# Patient Record
Sex: Male | Born: 1990 | Race: White | Hispanic: Yes | Marital: Single
Health system: Southern US, Community
[De-identification: ages and names within clinical notes are randomized; demographics above are authoritative.]

---

## 2018-07-22 ENCOUNTER — Emergency Department (HOSPITAL_COMMUNITY): Payer: No Typology Code available for payment source

## 2018-07-22 ENCOUNTER — Emergency Department (HOSPITAL_COMMUNITY)
Admission: EM | Admit: 2018-07-22 | Discharge: 2018-07-27 | Disposition: E | Payer: No Typology Code available for payment source | Attending: Emergency Medicine | Admitting: Emergency Medicine

## 2018-07-22 DIAGNOSIS — T1490XA Injury, unspecified, initial encounter: Secondary | ICD-10-CM | POA: Diagnosis not present

## 2018-07-22 DIAGNOSIS — Y999 Unspecified external cause status: Secondary | ICD-10-CM | POA: Insufficient documentation

## 2018-07-22 DIAGNOSIS — J942 Hemothorax: Secondary | ICD-10-CM | POA: Insufficient documentation

## 2018-07-22 DIAGNOSIS — G935 Compression of brain: Secondary | ICD-10-CM | POA: Diagnosis not present

## 2018-07-22 DIAGNOSIS — Y929 Unspecified place or not applicable: Secondary | ICD-10-CM | POA: Diagnosis not present

## 2018-07-22 DIAGNOSIS — Y939 Activity, unspecified: Secondary | ICD-10-CM | POA: Insufficient documentation

## 2018-07-22 LAB — COMPREHENSIVE METABOLIC PANEL
ALK PHOS: 67 U/L (ref 38–126)
ALT: 251 U/L — ABNORMAL HIGH (ref 0–44)
AST: 274 U/L — AB (ref 15–41)
Albumin: 2.9 g/dL — ABNORMAL LOW (ref 3.5–5.0)
Anion gap: 12 (ref 5–15)
BUN: 13 mg/dL (ref 6–20)
CALCIUM: 7.2 mg/dL — AB (ref 8.9–10.3)
CO2: 19 mmol/L — ABNORMAL LOW (ref 22–32)
Chloride: 103 mmol/L (ref 98–111)
Creatinine, Ser: 1.29 mg/dL — ABNORMAL HIGH (ref 0.61–1.24)
Glucose, Bld: 377 mg/dL — ABNORMAL HIGH (ref 70–99)
Potassium: 2.8 mmol/L — ABNORMAL LOW (ref 3.5–5.1)
Sodium: 134 mmol/L — ABNORMAL LOW (ref 135–145)
Total Bilirubin: 0.6 mg/dL (ref 0.3–1.2)
Total Protein: 4.6 g/dL — ABNORMAL LOW (ref 6.5–8.1)

## 2018-07-22 LAB — I-STAT CHEM 8, ED
BUN: 16 mg/dL (ref 6–20)
Calcium, Ion: 0.95 mmol/L — ABNORMAL LOW (ref 1.15–1.40)
Chloride: 101 mmol/L (ref 98–111)
Creatinine, Ser: 1.6 mg/dL — ABNORMAL HIGH (ref 0.61–1.24)
Glucose, Bld: 365 mg/dL — ABNORMAL HIGH (ref 70–99)
HEMATOCRIT: 34 % — AB (ref 39.0–52.0)
HEMOGLOBIN: 11.6 g/dL — AB (ref 13.0–17.0)
POTASSIUM: 2.4 mmol/L — AB (ref 3.5–5.1)
SODIUM: 136 mmol/L (ref 135–145)
TCO2: 20 mmol/L — AB (ref 22–32)

## 2018-07-22 LAB — ETHANOL: ALCOHOL ETHYL (B): 233 mg/dL — AB (ref <10)

## 2018-07-22 LAB — I-STAT CG4 LACTIC ACID, ED: Lactic Acid, Venous: 5.22 mmol/L (ref 0.5–1.9)

## 2018-07-22 MED ORDER — IOPAMIDOL (ISOVUE-300) INJECTION 61%
100.0000 mL | Freq: Once | INTRAVENOUS | Status: AC | PRN
  Administered 2018-07-22: 100 mL via INTRAVENOUS

## 2018-07-22 MED ORDER — SODIUM CHLORIDE 0.9 % IV SOLN
INTRAVENOUS | Status: AC | PRN
  Administered 2018-07-22: 1000 mL via INTRAVENOUS
  Administered 2018-07-22: 2000 mL via INTRAVENOUS

## 2018-07-22 MED ORDER — NOREPINEPHRINE 4 MG/250ML-% IV SOLN
0.0000 ug/min | Freq: Once | INTRAVENOUS | Status: AC
  Administered 2018-07-22: 5 ug/min via INTRAVENOUS
  Filled 2018-07-22: qty 250

## 2018-07-22 NOTE — ED Notes (Signed)
Called Blood Bank, Activated the Mass Transfusion

## 2018-07-22 NOTE — ED Notes (Addendum)
Pt critical labs of elevated Lactic and low K notified Dr. Eudelia Bunchardama

## 2018-07-22 NOTE — ED Notes (Signed)
Unit #5 of blood
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
## 2018-07-23 ENCOUNTER — Encounter (HOSPITAL_COMMUNITY): Payer: Self-pay | Admitting: *Deleted

## 2018-07-23 LAB — BPAM RBC
BLOOD PRODUCT EXPIRATION DATE: 201908082359
BLOOD PRODUCT EXPIRATION DATE: 201908082359
BLOOD PRODUCT EXPIRATION DATE: 201909012359
BLOOD PRODUCT EXPIRATION DATE: 201909012359
BLOOD PRODUCT EXPIRATION DATE: 201909012359
Blood Product Expiration Date: 201908052359
Blood Product Expiration Date: 201908112359
Blood Product Expiration Date: 201908112359
Blood Product Expiration Date: 201908112359
Blood Product Expiration Date: 201909012359
ISSUE DATE / TIME: 201907272250
ISSUE DATE / TIME: 201907272250
ISSUE DATE / TIME: 201907272315
ISSUE DATE / TIME: 201907272315
ISSUE DATE / TIME: 201907272315
ISSUE DATE / TIME: 201907272332
ISSUE DATE / TIME: 201907280936
ISSUE DATE / TIME: 201907272315
ISSUE DATE / TIME: 201907272332
ISSUE DATE / TIME: 201907272332
UNIT TYPE AND RH: 5100
UNIT TYPE AND RH: 9500
UNIT TYPE AND RH: 9500
Unit Type and Rh: 5100
Unit Type and Rh: 5100
Unit Type and Rh: 5100
Unit Type and Rh: 9500
Unit Type and Rh: 9500
Unit Type and Rh: 9500
Unit Type and Rh: 9500

## 2018-07-23 LAB — TYPE AND SCREEN
ABO/RH(D): O POS
Antibody Screen: NEGATIVE
UNIT DIVISION: 0
UNIT DIVISION: 0
UNIT DIVISION: 0
UNIT DIVISION: 0
Unit division: 0
Unit division: 0
Unit division: 0
Unit division: 0
Unit division: 0
Unit division: 0

## 2018-07-23 LAB — BPAM FFP
BLOOD PRODUCT EXPIRATION DATE: 201908162359
BLOOD PRODUCT EXPIRATION DATE: 201908172359
BLOOD PRODUCT EXPIRATION DATE: 201908202359
Blood Product Expiration Date: 201907312359
Blood Product Expiration Date: 201907312359
Blood Product Expiration Date: 201907312359
Blood Product Expiration Date: 201908102359
Blood Product Expiration Date: 201908172359
ISSUE DATE / TIME: 201907272251
ISSUE DATE / TIME: 201907272251
ISSUE DATE / TIME: 201907272317
ISSUE DATE / TIME: 201907272317
ISSUE DATE / TIME: 201907272317
ISSUE DATE / TIME: 201907272333
ISSUE DATE / TIME: 201907272317
ISSUE DATE / TIME: 201907272333
UNIT TYPE AND RH: 6200
UNIT TYPE AND RH: 6200
UNIT TYPE AND RH: 6200
Unit Type and Rh: 600
Unit Type and Rh: 600
Unit Type and Rh: 600
Unit Type and Rh: 6200
Unit Type and Rh: 6200

## 2018-07-23 LAB — PREPARE PLATELET PHERESIS: Unit division: 0

## 2018-07-23 LAB — PREPARE FRESH FROZEN PLASMA
UNIT DIVISION: 0
Unit division: 0
Unit division: 0
Unit division: 0
Unit division: 0

## 2018-07-23 LAB — CBC
HCT: 38 % — ABNORMAL LOW (ref 39.0–52.0)
Hemoglobin: 11.6 g/dL — ABNORMAL LOW (ref 13.0–17.0)
MCH: 27.9 pg (ref 26.0–34.0)
MCHC: 30.5 g/dL (ref 30.0–36.0)
MCV: 91.3 fL (ref 78.0–100.0)
PLATELETS: 212 10*3/uL (ref 150–400)
RBC: 4.16 MIL/uL — AB (ref 4.22–5.81)
RDW: 12.1 % (ref 11.5–15.5)
WBC: 21.6 10*3/uL — AB (ref 4.0–10.5)

## 2018-07-23 LAB — BPAM PLATELET PHERESIS
BLOOD PRODUCT EXPIRATION DATE: 201907282359
ISSUE DATE / TIME: 201907272322
UNIT TYPE AND RH: 7300

## 2018-07-23 LAB — I-STAT CHEM 8, ED
BUN: 11 mg/dL (ref 6–20)
CALCIUM ION: 0.61 mmol/L — AB (ref 1.15–1.40)
Chloride: 110 mmol/L (ref 98–111)
Creatinine, Ser: 1.1 mg/dL (ref 0.61–1.24)
GLUCOSE: 219 mg/dL — AB (ref 70–99)
HEMATOCRIT: 29 % — AB (ref 39.0–52.0)
Hemoglobin: 9.9 g/dL — ABNORMAL LOW (ref 13.0–17.0)
Potassium: 3 mmol/L — ABNORMAL LOW (ref 3.5–5.1)
Sodium: 144 mmol/L (ref 135–145)
TCO2: 16 mmol/L — AB (ref 22–32)

## 2018-07-23 LAB — DIC (DISSEMINATED INTRAVASCULAR COAGULATION)PANEL
D-Dimer, Quant: >20 ug/mL-FEU — ABNORMAL HIGH (ref 0.00–0.50)
INR: 3.07
Platelets: 212 10*3/uL (ref 150–400)
Prothrombin Time: 31.4 seconds — ABNORMAL HIGH (ref 11.4–15.2)
Smear Review: NONE SEEN
aPTT: 100 seconds — ABNORMAL HIGH (ref 24–36)

## 2018-07-23 LAB — PROTIME-INR
INR: 3.09
Prothrombin Time: 31.6 seconds — ABNORMAL HIGH (ref 11.4–15.2)

## 2018-07-23 LAB — DIC (DISSEMINATED INTRAVASCULAR COAGULATION) PANEL

## 2018-07-23 LAB — CDS SEROLOGY

## 2018-07-23 LAB — ABO/RH: ABO/RH(D): O POS

## 2018-07-23 LAB — MASSIVE TRANSFUSION PROTOCOL ORDER (BLOOD BANK NOTIFICATION)

## 2018-07-27 NOTE — ED Notes (Signed)
[facility]

## 2018-07-27 NOTE — ED Notes (Signed)
Lev[facility]hed and fluids [facility]

## 2018-07-27 NOTE — ED Notes (Signed)
Stuart Kennedy at bedside; will attempt to locate next of kin for family

## 2018-07-27 NOTE — ED Notes (Signed)
Multiple attempts at OG, unsuccesful. NG contraindicated for basilar skull fracture
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
## 2018-07-27 NOTE — ED Provider Notes (Signed)
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 Alignment: Normal. Skull base and vertebrae: Comminuted mildly displaced fracture through left C4 lamina. No vertebral body involvement. No additional fracture. Vertebral body heights are preserved. Soft tissues and spinal canal: Scattered extra-axial air tracking from skull-base. No confluent canal hematoma. Heterogeneous density fills the hypopharynx above the endotracheal tube. Disc levels:  The disc spaces are preserved. Upper chest: Fully characterized on concurrent chest CT. Other: None. IMPRESSION: 1. Severe traumatic head injury. Diffuse cerebral edema with sulcal effacement and tonsillar herniation, obliteration of the basilar cisterns. Scattered pneumocephalus and subarachnoid hemorrhage. Possible small extra-axial hemorrhage in the right temporal region at site of temporal fracture. 2. Complex skull fracture with involvement of the skull base, right temporal and frontal bones. Frontal bone component is comminuted and depressed of approximately 3 mm. 3. Right-sided LeFort type 3 facial  fractures involving the pterygoid plate, zygomatic arch, and right orbit. 4. Comminuted mildly displaced left C4 lamina fracture, no vertebral body involvement. Critical Value/emergent preliminary results were discussed in person at the time of image acquisition on 07/12/2018 at 12:06 am with Dr. Magnus Ivan , who verbally acknowledged these results. Electronically Signed   By: Rubye Oaks M.D.   On: 06/29/2018 00:42   Pertinent labs & imaging results that were available during my care of the patient were reviewed by me and considered in my medical decision making (see chart for details).  Medications Ordered in ED Medications  norepinephrine (LEVOPHED) 4mg  in D5W premix infusion (0 mcg/min Intravenous Stopped 07/18/2018 0040)  0.9 %  sodium chloride infusion ( Intravenous Stopped 07/17/2018 0040)  iopamidol (ISOVUE-300) 61 % injection 100 mL (100 mLs Intravenous Contrast Given 08/10/18 2347)  Procedures .Critical Care Performed by: Nira Conn, MD Authorized by: Nira Conn, MD     CRITICAL CARE Performed by: Amadeo Garnet Mazi Bra[place] Total critical care time: 55 minutes Critical care time was exclusive of separately billable procedures and treating other patients. Critical care was necessary to treat or prevent imminent or life-threatening deterioration. Critical care was time spent personally by me on the following activities: development of treatment plan with patient and/or surrogate as well as nursing, discussions with consultants, evaluation of patient's response to treatment, examination of patient, obtaining history from patient or surrogate, ordering and performing treatments and interventions, ordering and review of laboratory studies, ordering and review of radiographic studies, pulse oximetry and re-evaluation of patient's  condition.  EMERGENCY DEPARTMENT [place] FAST EXAM "Limited Ultrasound of the Abdomen and Pericardium" (FAST Exam).   INDICATIONS:Abnornal vitals Multiple views of the abdomen and pericardium are obtained with a multi-frequency probe.  PERFORMED BY: Myself IMAGES ARCHIVED?: No LIMITATIONS:  Emergent procedure INTERPRETATION:  No abdominal free fluid and No pericardial effusion   (including critical care time)  Medical Decision Making / ED Course I have reviewed the nursing notes for this encounter and the patient's prior records (if available in EHR or on provided paperwork).    Airway intact with ET tube in place.  Bilateral breath sounds mildly diminished on the left.  Patient apneic.  Initial manual blood pressure 80s over 40s.  Blood infusion and IV fluid infusion initiated.  Pressure dressing applied to scalp.   Bedside ultrasound fast negative for hemoperitoneum.   Chest x-ray revealed right mainstem.  ET tube repositioned.  Chest x-ray also with haziness of the left lung.  Chest tube placed by trauma surgery, notable for small hemothorax.  Pressures labile.  Mass transfusion protocol initiated.  Patient also required pressors.  Patient stable for imaging.  CT of the head revealed nonsurvivable injuries including herniation.  Rest of the imaging as above.  Trauma surgery discussed case with neurosurgery who agreed with nonsurvivable brain injury.  Resuscitation was discontinued.  Time of death 1 AM.  Medical examiner contacted.  Will contact [employer] to assist with family notification.      Final Clinical Impression(s) / ED Diagnoses Final diagnoses:  Trauma      This chart was dictated using voice recognition software.  Despite best efforts to proofread,  errors can occur which can change the documentation meaning.   Nira Conn, MD 07/19/2018 [phone]

## 2018-07-27 NOTE — ED Notes (Signed)
Patient arrives as a level 1 trauma activation. Pt was manually ventilated on arrival per EMS. Pt was manually ventilated with a flow resistor valve to attempt to maintain good oxygen saturations. Once patient was on the Ventilator using 10cmh2o PEEP therapy patient would immediately begin to desaturate. Pt oxygen saturations were 62% with a good pleth. Pt was taken off the vent and was manually ventilated with efforts to achieve good saturations. Pt was suctioned got back moderate to copious amounts of fresh red blood. Oxygenation status became acceptable after about 10minutes of continuous manually ventilation. Pt was placed back on the ventilator and was transported to CT for exams. Once in CT, oxygenation status began to become compromised again. Pt manually ventilated throughout the CT exam due to oxygen desaturation.  Once manually ventilated with the PEEP valve SATs would return back to acceptable ranges. Patient had obvious head and scalp injuries with large laceration in which it exposed skull fractures. Patient had absolutely no respiratory efforts or spontaneous respirations at any point. Concerning the patients clinical status of being hypotensive, with the nature of head injuries that were sustained. Time of Death was 0100 per Dr. Magnus IvanBlackman. Patient was taking off the ventilator per MD.

## 2018-07-27 NOTE — Progress Notes (Signed)
Patient ID: Stuart Kennedy, male   DOB: January 07, 1991, 27 y.o.   MRN: [id number]  Reviewed CT head and discussed with Trauma...diffuse ground glass appearance, with extensive SAH and pneumocephalus tracking around the brainstem and foramen magnum with complete loss of gray/white differentiation, extensive basilar skull fractures and supported by a clinicall exam of pupils fixed and dilated with no movement to stimulation is not survivable.    Exam and imaging consistent with brain death.

## 2018-07-27 NOTE — ED Notes (Signed)
Returned call from NP with NS to Dr.Blackman

## 2018-07-27 NOTE — Progress Notes (Signed)
Patient ID: Stuart Kennedy, male   DOB: June 26, 1991, 27 y.o.   MRN: [id number]   Physical exam consistent with brain death.  Patient no longer has a pulse.  Efforts stopped.  Time of death is 0100 hours.  Medical examiner notified by EDP.

## 2018-07-27 NOTE — ED Notes (Signed)
[facility] paged to Dr Magnus IvanBlackman

## 2018-07-27 NOTE — ED Notes (Signed)
Notified Dr. Eudelia Bunchardama of pt's critical labs, hemoglobin 9.9.

## 2018-07-27 DEATH — deceased

## 2019-07-28 IMAGING — CT CT CHEST W/ CM
2 of 6 series · 12 of 36 positions shown, 15 images · IV contrast (iopamidol)
Comparison: Chest radiographs earlier this day.

CLINICAL DATA: Level 1 trauma.  Post CPR.

EXAM:
CT CHEST, ABDOMEN, AND PELVIS WITH CONTRAST
TECHNIQUE: Multidetector CT imaging of the chest, abdomen and pelvis was
performed following the standard protocol during bolus
administration of intravenous contrast.
CONTRAST:  100mL SC07C2-FDD IOPAMIDOL (SC07C2-FDD) INJECTION 61%

[Series 10: cap with 3.0 mm st cor · coronal · 0.68mm/px · 3 of 116 slices shown]
[im 24/116  lung]
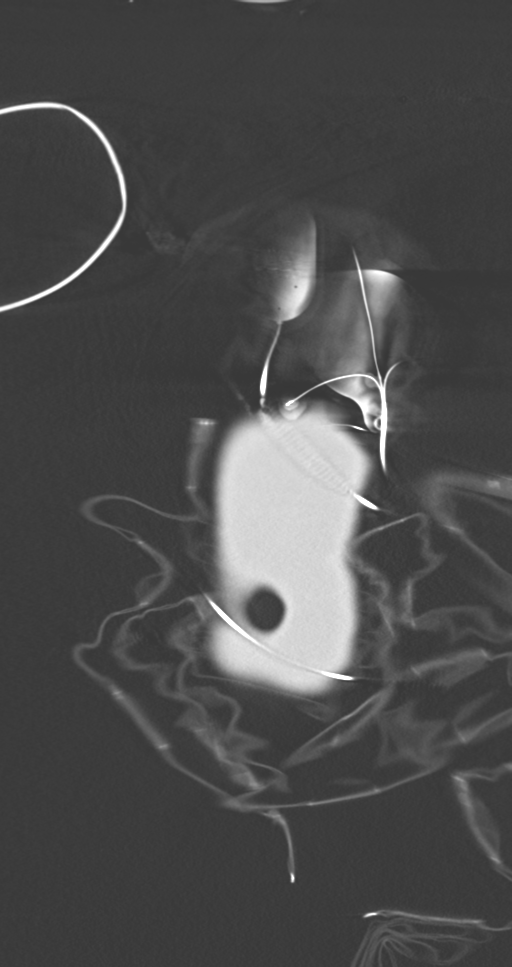
[im 47/116  lung]
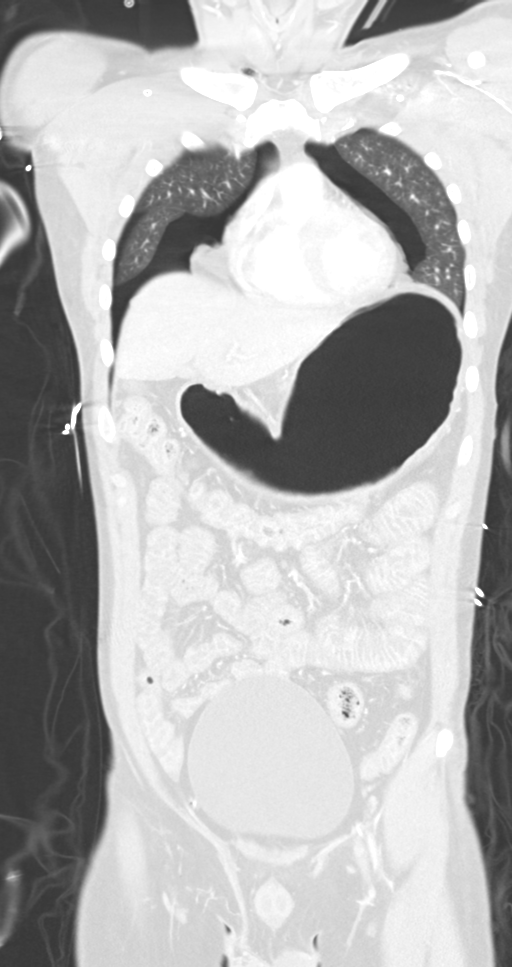
[im 70/116  lung]
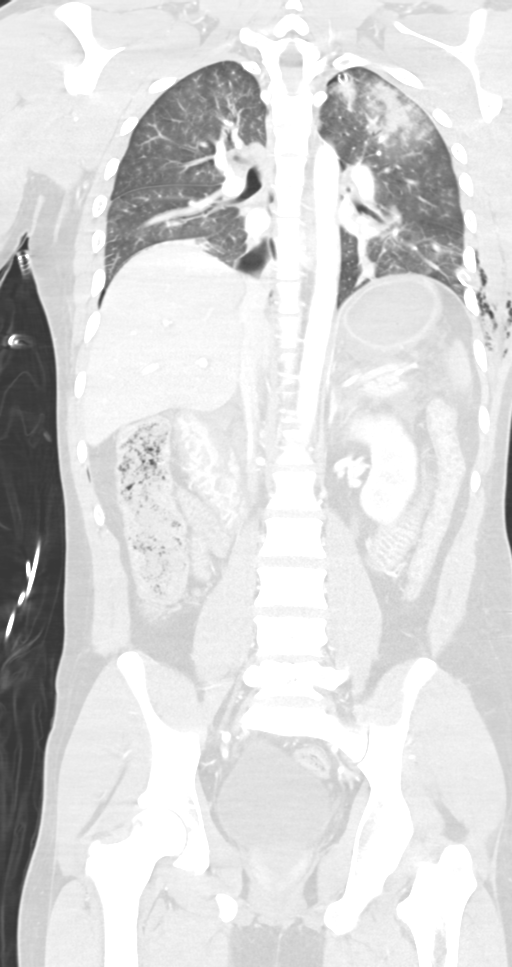

[Series 13: cap thins · axial · 0.85mm/px · z∈[-809,-253]mm · 9 of 661 slices shown, 12 images]
[im 70/661  mediastinal]
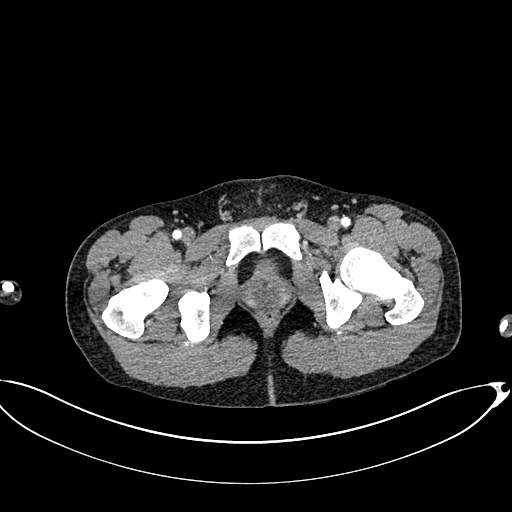
[im 70/661  lung]
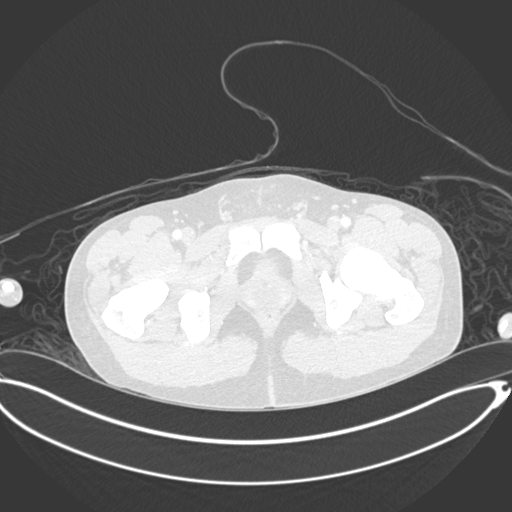
[im 139/661  lung]
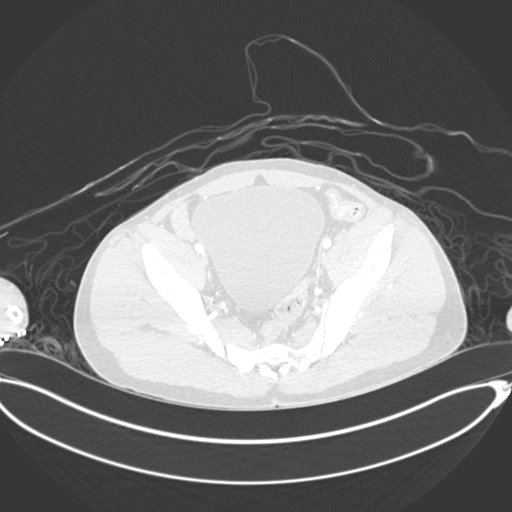
[im 209/661  lung]
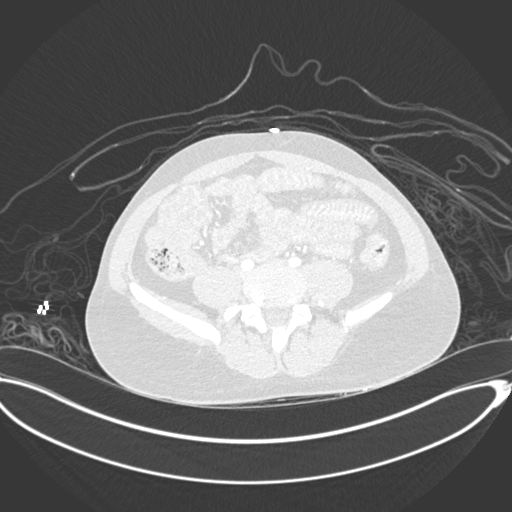
[im 278/661  lung]
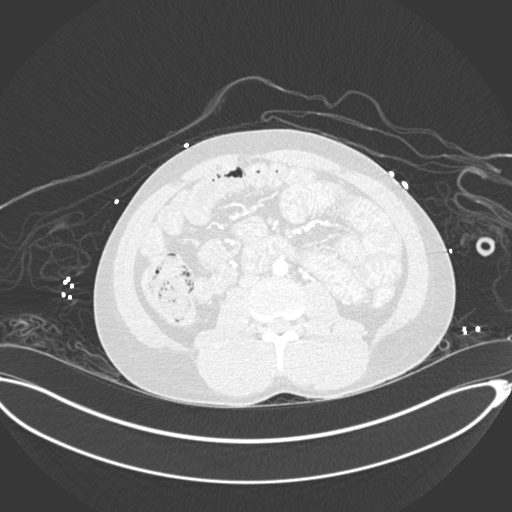
[im 348/661  mediastinal]
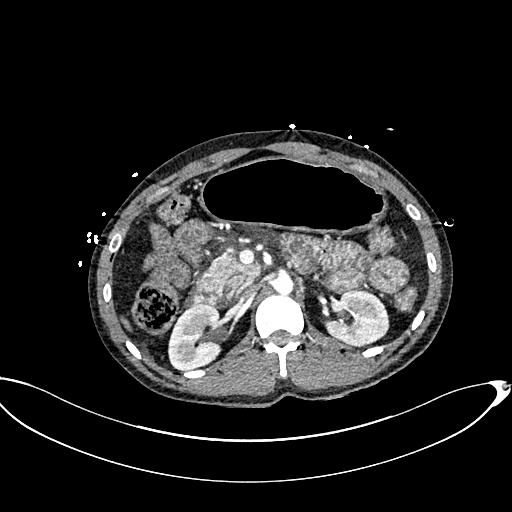
[im 348/661  lung]
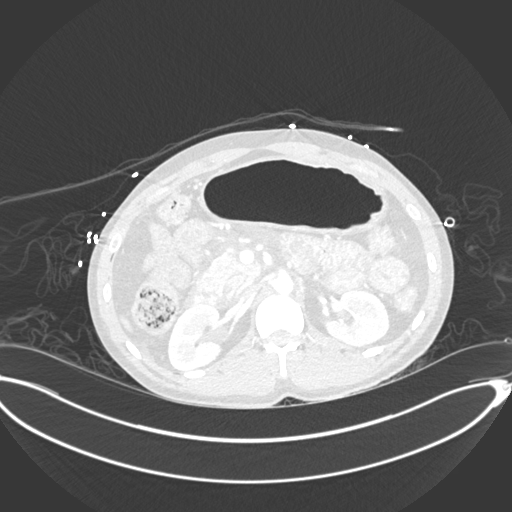
[im 417/661  lung]
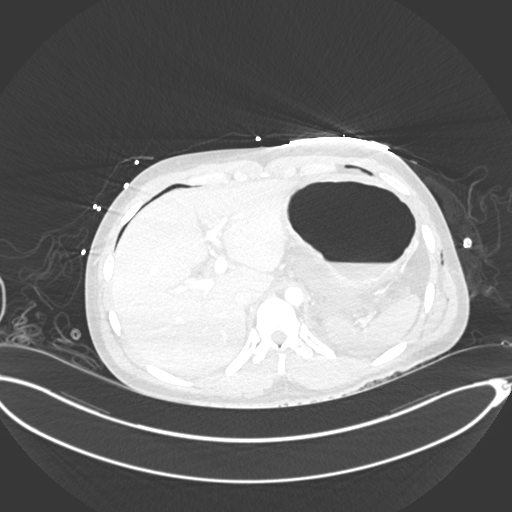
[im 487/661  lung]
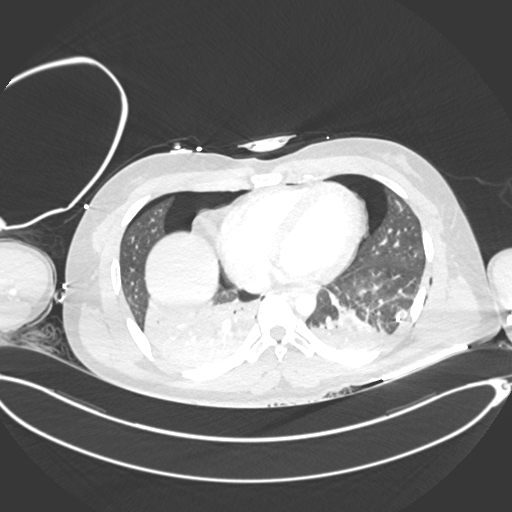
[im 556/661  lung]
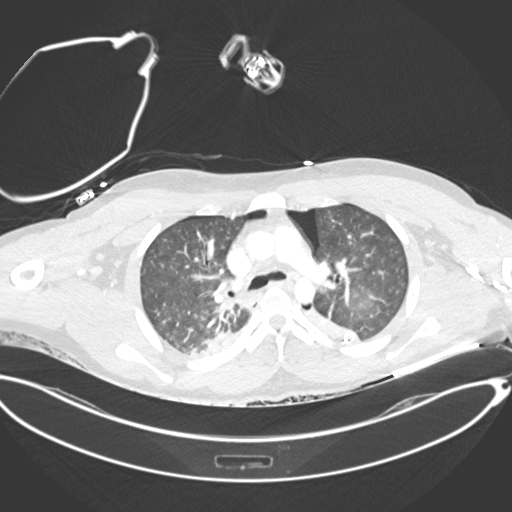
[im 626/661  mediastinal]
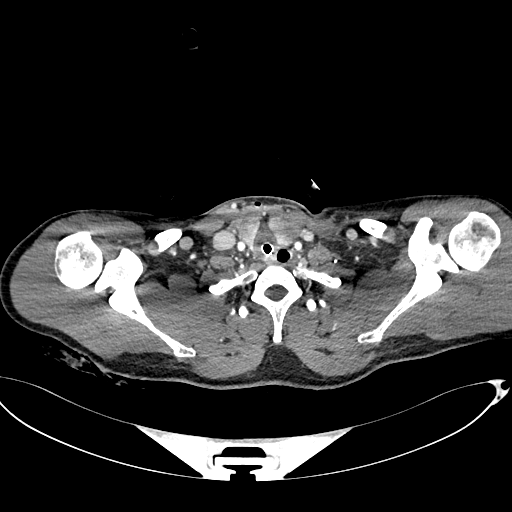
[im 626/661  lung]
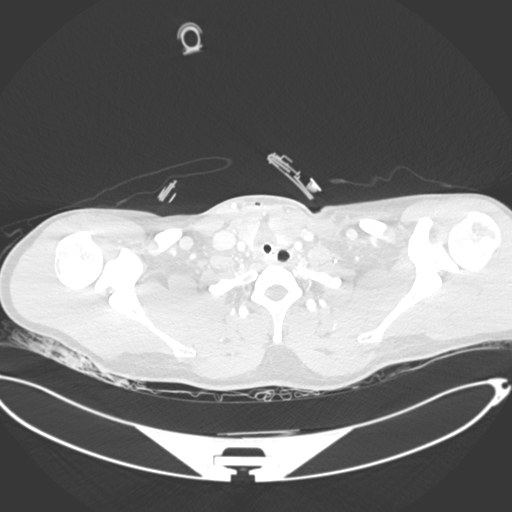

[12 of 36 positions shown; findings below may reference images not displayed]

FINDINGS: CT CHEST FINDINGS

Cardiovascular: No acute aortic injury. Heart is normal in size. No
pericardial fluid.

Mediastinum/Nodes: Minimal pneumomediastinum anterior to the heart.
Distal esophagus is patulous. No mediastinal contusion. No
adenopathy. Heterogeneous thyroid gland.

Lungs/Pleura: Bilateral needle decompression in the upper chest with
tips approaching the pleural surface. Left-sided chest tube in place
tip directed towards the apex. Small to moderate residual left
pneumothorax primarily inferior anteriorly. Small to moderate right
anterior inferior pneumothorax. Patchy contusion and tree in bud
opacities throughout upper lobes of both lungs, possible element of
aspiration. Near complete atelectasis/collapse of the right lower
lobe and partial atelectasis/collapse of the left lower lobe.
Endotracheal tube above the carina.

Musculoskeletal: No fracture of the sternum, ribs, thoracic spine,
included shoulder girdles or clavicles. Subcutaneous emphysema about
the left chest wall likely related to chest tube placement.

CT ABDOMEN PELVIS FINDINGS

Hepatobiliary: No evidence of hepatic injury. Mild periportal edema.
Mild pericholecystic edema and gallbladder wall enhancement. Trace
perihepatic fluid about the inferior liver.

Pancreas: Findings suspicious for pancreatic head injury with
ill-defined parenchymal low-density and diffuse peripancreatic
edema. No ductal dilatation.

Spleen: Small cleft at the hilum. No evidence of laceration. Small
amount of perisplenic fluid.

Adrenals/Urinary Tract: No adrenal hemorrhage. Equivocal
hyperenhancement of the adrenal glands. No evidence of renal injury.
Bladder is distended. No bladder wall thickening or evidence of
injury.

Stomach/Bowel: Stomach distended air and fluid. Edema about the
duodenum and retroperitoneum. Diffuse small bowel wall thickening
and enhancement and shock bowel pattern. No evidence of pneumatosis.
No mesenteric hematoma.

Vascular/Lymphatic: Abdominal aorta is intact without evidence of
injury. IVC is slit-like at the level of the renal arteries. Small
amount of retroperitoneal fluid. Air in the right common femoral
vein likely from IV catheter placement. No bulky adenopathy.

Reproductive: Prostate is unremarkable.

Other: Free fluid in the upper abdomen and retroperitoneum with
minimal tracking about the liver and spleen. Small amount of
extraperitoneal free air in the right abdomen is likely tracking
from the right thorax. No confluent body wall contusion.

Musculoskeletal: No pelvic or lumbar spine fracture.
IMPRESSION: 1. Bilateral small to moderate pneumothoraces, left-sided chest tube
in place and bilateral needle decompression, with needle tips
approaching the pleural surface. Bilateral upper lobe pulmonary
contusions and tree in bud opacities suggesting an element of
concurrent aspiration.
2. Near complete collapse of the right lower lobe and partial
collapse of the left lower lobe.
3. Findings suspicious for pancreatic head contusion/injury with
peripancreatic and retroperitoneal fluid.
4. Small bowel mucosal enhancement suggesting shock bowel. Equivocal
hyperenhancement to the adrenal glands which can be seen with shock.
